# Patient Record
Sex: Female | Born: 1970 | Race: White | Hispanic: No | Marital: Married | Smoking: Never smoker
Health system: Southern US, Community
[De-identification: ages and names within clinical notes are randomized; demographics above are authoritative.]

---

## 2010-08-04 ENCOUNTER — Encounter
Admission: RE | Admit: 2010-08-04 | Discharge: 2010-08-04 | Payer: Self-pay | Source: Home / Self Care | Attending: Family Medicine | Admitting: Family Medicine

## 2011-10-05 ENCOUNTER — Other Ambulatory Visit: Payer: Self-pay | Admitting: Obstetrics and Gynecology

## 2011-10-05 DIAGNOSIS — Z1231 Encounter for screening mammogram for malignant neoplasm of breast: Secondary | ICD-10-CM

## 2011-10-26 ENCOUNTER — Ambulatory Visit
Admission: RE | Admit: 2011-10-26 | Discharge: 2011-10-26 | Disposition: A | Discharge status: Home / Self Care | Payer: BC Managed Care – PPO | Source: Ambulatory Visit | Attending: Obstetrics and Gynecology | Admitting: Obstetrics and Gynecology

## 2011-10-26 DIAGNOSIS — Z1231 Encounter for screening mammogram for malignant neoplasm of breast: Secondary | ICD-10-CM

## 2012-12-12 ENCOUNTER — Other Ambulatory Visit: Payer: Self-pay

## 2012-12-12 DIAGNOSIS — Z1231 Encounter for screening mammogram for malignant neoplasm of breast: Secondary | ICD-10-CM

## 2013-01-02 ENCOUNTER — Ambulatory Visit: Payer: BC Managed Care – PPO

## 2013-02-09 ENCOUNTER — Ambulatory Visit
Admission: RE | Admit: 2013-02-09 | Discharge: 2013-02-09 | Disposition: A | Discharge status: Home / Self Care | Payer: BC Managed Care – PPO | Source: Ambulatory Visit

## 2013-02-09 DIAGNOSIS — Z1231 Encounter for screening mammogram for malignant neoplasm of breast: Secondary | ICD-10-CM

## 2014-02-25 ENCOUNTER — Other Ambulatory Visit: Payer: Self-pay

## 2014-02-25 DIAGNOSIS — Z1231 Encounter for screening mammogram for malignant neoplasm of breast: Secondary | ICD-10-CM

## 2014-03-29 ENCOUNTER — Ambulatory Visit
Admission: RE | Admit: 2014-03-29 | Discharge: 2014-03-29 | Disposition: A | Discharge status: Home / Self Care | Payer: BC Managed Care – PPO | Source: Ambulatory Visit

## 2014-03-29 DIAGNOSIS — Z1231 Encounter for screening mammogram for malignant neoplasm of breast: Secondary | ICD-10-CM

## 2015-06-17 ENCOUNTER — Other Ambulatory Visit: Payer: Self-pay | Admitting: Obstetrics and Gynecology

## 2015-06-17 DIAGNOSIS — N6452 Nipple discharge: Secondary | ICD-10-CM

## 2015-07-04 ENCOUNTER — Ambulatory Visit
Admission: RE | Admit: 2015-07-04 | Discharge: 2015-07-04 | Disposition: A | Discharge status: Home / Self Care | Payer: BLUE CROSS/BLUE SHIELD | Source: Ambulatory Visit | Attending: Obstetrics and Gynecology | Admitting: Obstetrics and Gynecology

## 2015-07-04 ENCOUNTER — Other Ambulatory Visit: Payer: Self-pay | Admitting: Obstetrics and Gynecology

## 2015-07-04 DIAGNOSIS — N6452 Nipple discharge: Secondary | ICD-10-CM

## 2015-11-29 ENCOUNTER — Other Ambulatory Visit: Payer: Self-pay | Admitting: Obstetrics and Gynecology

## 2015-11-29 DIAGNOSIS — N6452 Nipple discharge: Secondary | ICD-10-CM

## 2015-11-30 ENCOUNTER — Other Ambulatory Visit: Payer: Self-pay | Admitting: Obstetrics and Gynecology

## 2015-11-30 DIAGNOSIS — N6452 Nipple discharge: Secondary | ICD-10-CM

## 2016-08-10 ENCOUNTER — Other Ambulatory Visit: Payer: Self-pay | Admitting: Obstetrics and Gynecology

## 2016-08-10 DIAGNOSIS — Z1231 Encounter for screening mammogram for malignant neoplasm of breast: Secondary | ICD-10-CM

## 2016-11-17 IMAGING — MG MM DIAG BREAST TOMO BILATERAL
6 of 9 series · 6 of 25 positions shown · non-contrast
Comparison: Previous exam(s).

CLINICAL DATA: Chronic right nipple discharge. Cytology evaluation
demonstrated presence of blood within the discharge.

EXAM:
DIGITAL DIAGNOSTIC BILATERAL MAMMOGRAM WITH 3D TOMOSYNTHESIS WITH
CAD
ULTRASOUND RIGHT BREAST

[R MLO (1 of 2)]
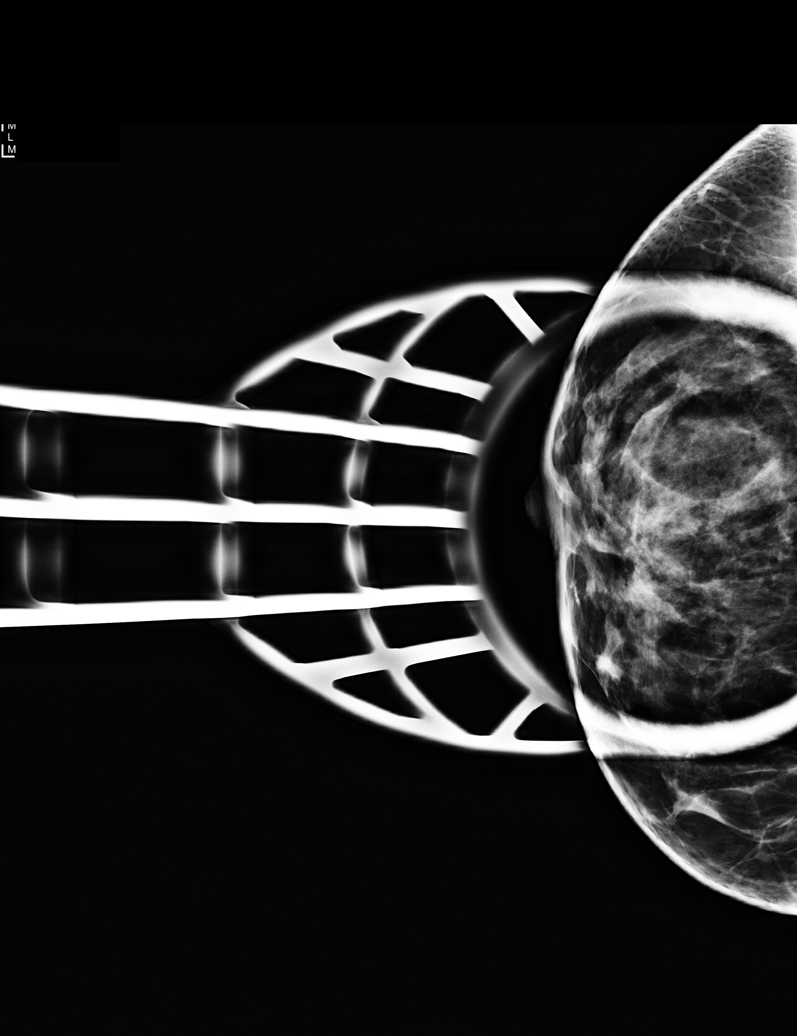

[L MLO]
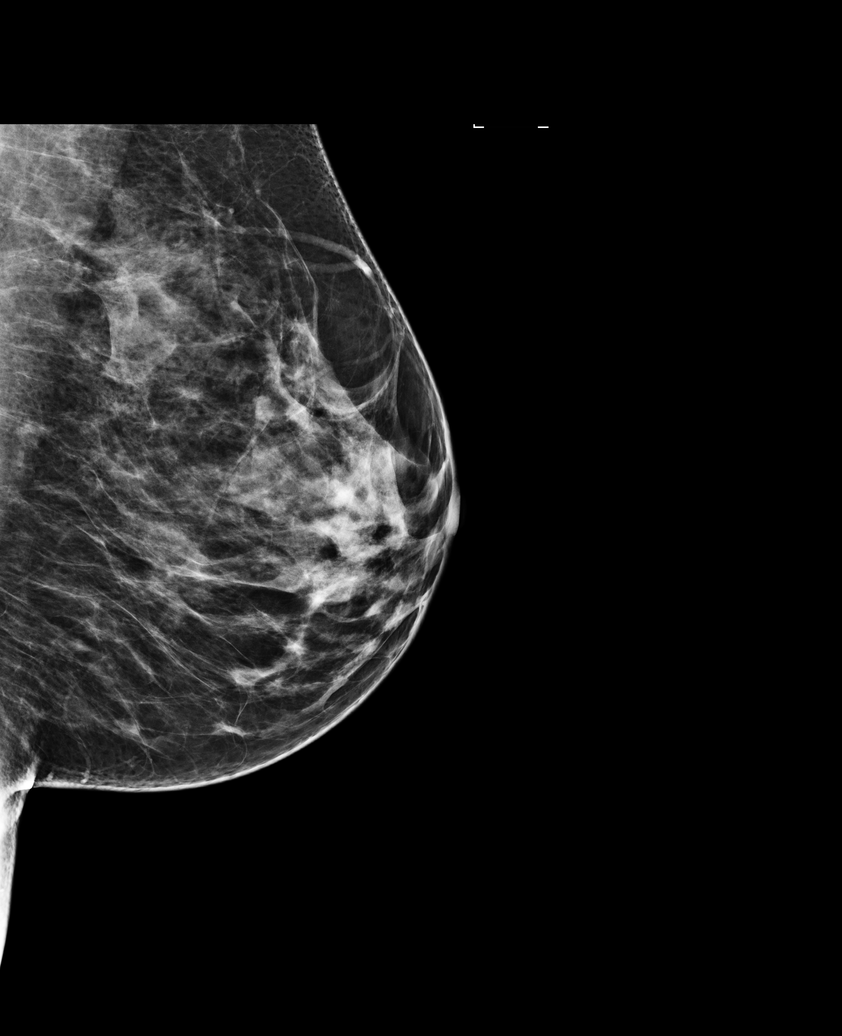

[R CC]
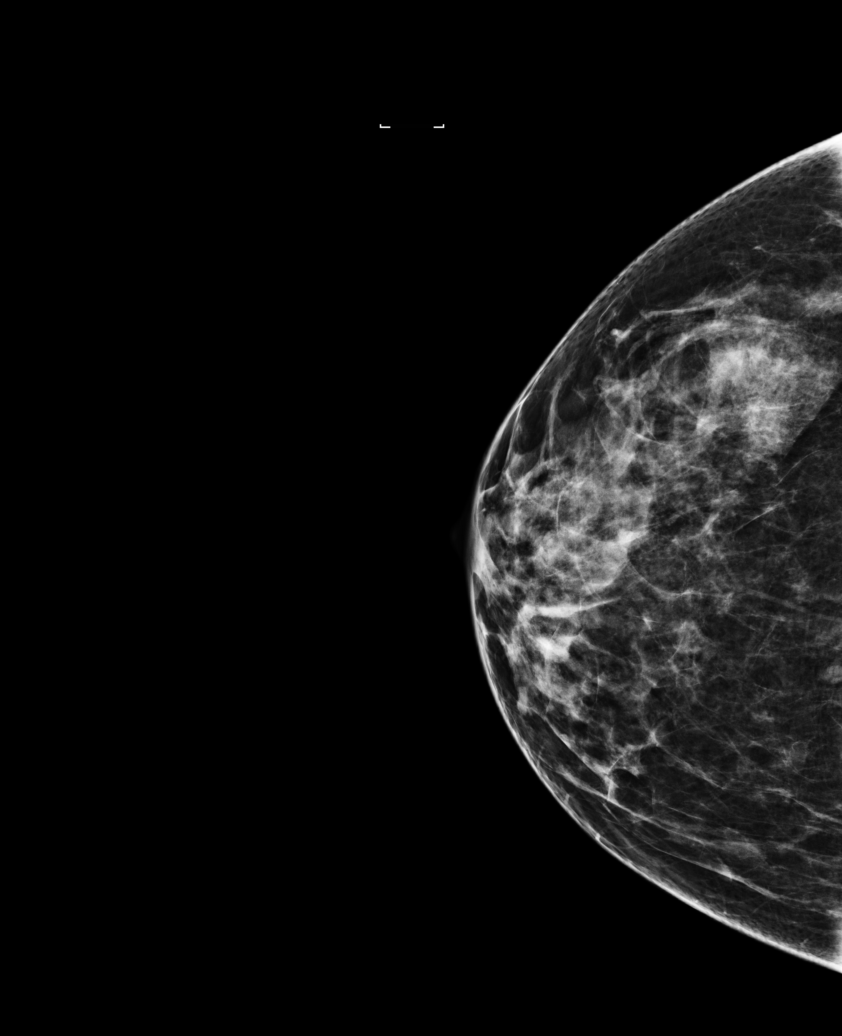

[L CC]
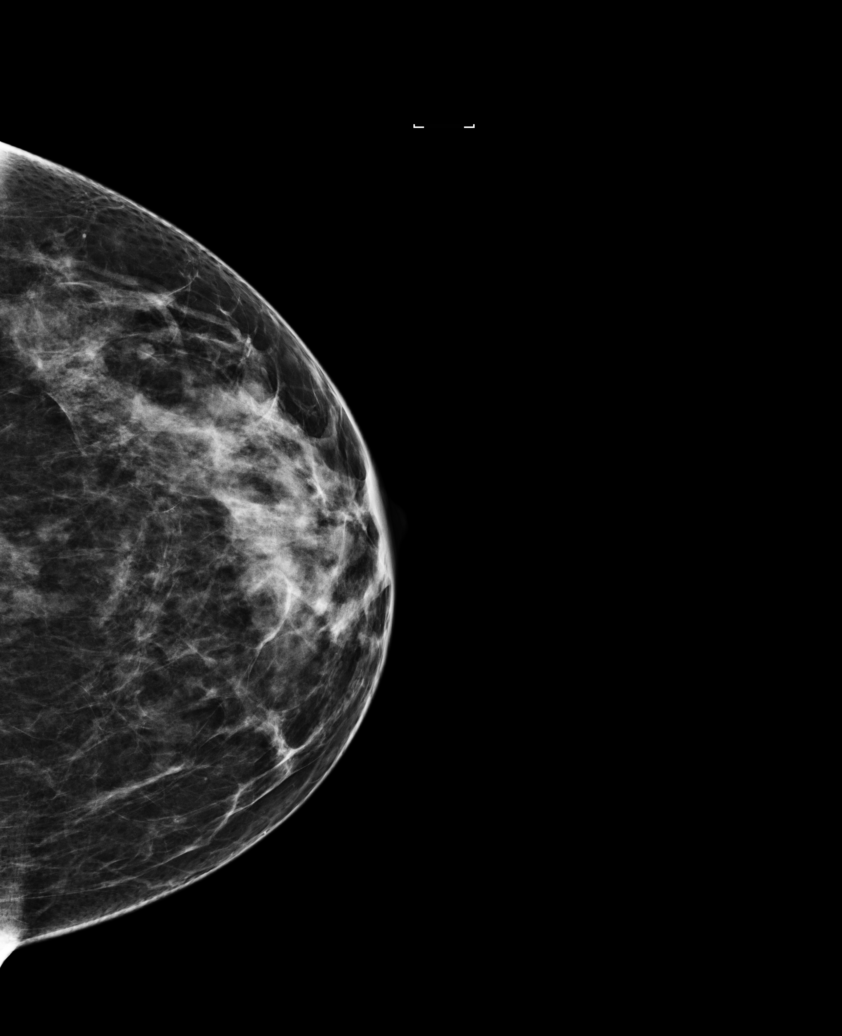

[R MLO (2 of 2)]
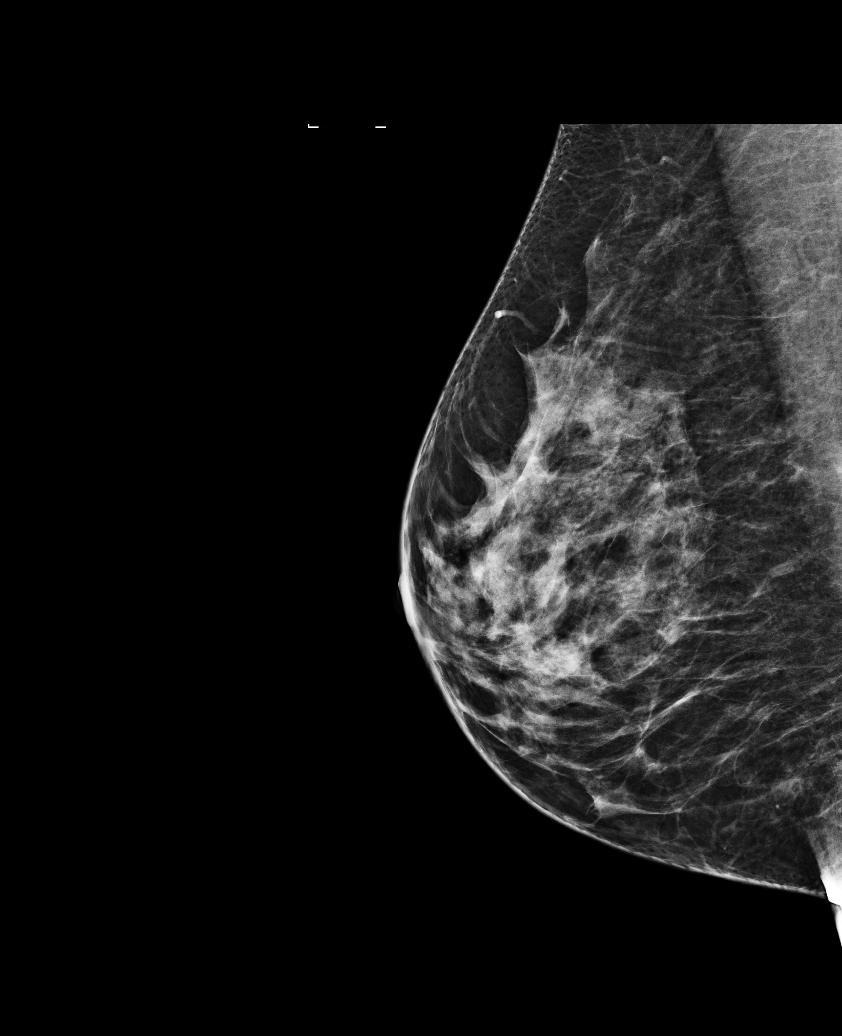

[R MLO tomo · tomo slice 37/72.0]
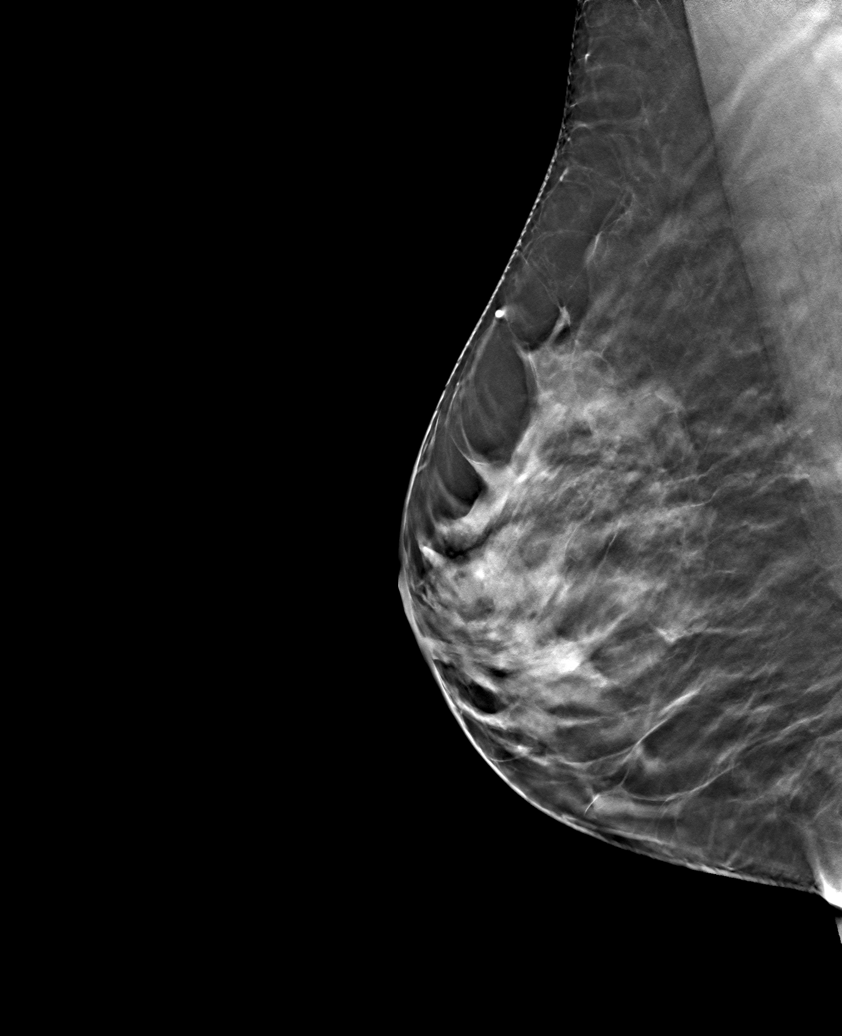

[6 of 25 positions shown; findings below may reference images not displayed]

ACR Breast Density Category c: The breast tissue is heterogeneously
dense, which may obscure small masses.
FINDINGS: Mammographically, there are no suspicious masses, areas of
architectural distortion or microcalcifications in either breast.

Mammographic images were processed with CAD.

On physical exam, no suspicious masses are found. Compression of the
right nipple elicits nipple discharge from 2 ducts. The duct in the
lower outer quadrant of the right breast expresses greenish milky
nipple discharge. The duct in the central right breast expresses
bloody nipple discharge. Because of the presence of bloody nipple
discharge, a ductogram was performed, cannulating the central duct
associated with bloody nipple discharge.

Targeted ultrasound is performed, showing no suspicious masses or
shadowing lesions. No significant duct ectasia is noted. Ultrasound
examination of the subareolar left breast was performed for
comparison and demonstrates similar appearance of subareolar ducts.
IMPRESSION: No mammographic or sonographic evidence of malignancy in either
breast.

Bloody nipple discharge was noted from a single duct in the central
right nipple, for which a ductogram was performed, which did not
demonstrate duct ectasia are or filling defects.

RECOMMENDATION:
Screening mammogram in one year.(Code:V1-7-49F).

Further management of patient's nipple discharge should be based on
clinical grounds. Contrast-enhanced MRI of the breast may be
considered if found clinically necessary.

I have discussed the findings and recommendations with the patient.
Results were also provided in writing at the conclusion of the
visit. If applicable, a reminder letter will be sent to the patient
regarding the next appointment.

BI-RADS CATEGORY  2: Benign.

## 2017-01-22 ENCOUNTER — Other Ambulatory Visit: Payer: Self-pay | Admitting: Orthopedic Surgery

## 2017-01-22 DIAGNOSIS — M25561 Pain in right knee: Secondary | ICD-10-CM

## 2017-02-08 ENCOUNTER — Ambulatory Visit
Admission: RE | Admit: 2017-02-08 | Discharge: 2017-02-08 | Disposition: A | Discharge status: Home / Self Care | Payer: PRIVATE HEALTH INSURANCE | Source: Ambulatory Visit | Attending: Orthopedic Surgery | Admitting: Orthopedic Surgery

## 2017-02-08 DIAGNOSIS — M25561 Pain in right knee: Secondary | ICD-10-CM

## 2018-04-14 ENCOUNTER — Other Ambulatory Visit: Payer: Self-pay | Admitting: Obstetrics and Gynecology

## 2018-04-14 DIAGNOSIS — Z1231 Encounter for screening mammogram for malignant neoplasm of breast: Secondary | ICD-10-CM

## 2018-09-05 ENCOUNTER — Other Ambulatory Visit: Payer: Self-pay | Admitting: Family Medicine

## 2018-09-05 ENCOUNTER — Ambulatory Visit
Admission: RE | Admit: 2018-09-05 | Discharge: 2018-09-05 | Disposition: A | Discharge status: Home / Self Care | Payer: No Typology Code available for payment source | Source: Ambulatory Visit | Attending: Family Medicine | Admitting: Family Medicine

## 2018-09-05 DIAGNOSIS — R05 Cough: Secondary | ICD-10-CM

## 2018-09-05 DIAGNOSIS — R0600 Dyspnea, unspecified: Secondary | ICD-10-CM

## 2018-09-05 DIAGNOSIS — R059 Cough, unspecified: Secondary | ICD-10-CM

## 2018-09-22 ENCOUNTER — Encounter: Payer: Self-pay | Admitting: Radiology

## 2018-09-22 ENCOUNTER — Ambulatory Visit
Admission: RE | Admit: 2018-09-22 | Discharge: 2018-09-22 | Disposition: A | Discharge status: Home / Self Care | Payer: PRIVATE HEALTH INSURANCE | Source: Ambulatory Visit | Attending: Obstetrics and Gynecology | Admitting: Obstetrics and Gynecology

## 2018-09-22 DIAGNOSIS — Z1231 Encounter for screening mammogram for malignant neoplasm of breast: Secondary | ICD-10-CM

## 2018-09-29 ENCOUNTER — Ambulatory Visit (INDEPENDENT_AMBULATORY_CARE_PROVIDER_SITE_OTHER): Payer: Self-pay | Admitting: Podiatry

## 2018-09-29 ENCOUNTER — Ambulatory Visit (INDEPENDENT_AMBULATORY_CARE_PROVIDER_SITE_OTHER): Payer: Self-pay

## 2018-09-29 ENCOUNTER — Encounter: Payer: Self-pay | Admitting: Podiatry

## 2018-09-29 ENCOUNTER — Other Ambulatory Visit: Payer: Self-pay | Admitting: Podiatry

## 2018-09-29 VITALS — BP 153/90 | HR 78

## 2018-09-29 DIAGNOSIS — M79671 Pain in right foot: Secondary | ICD-10-CM

## 2018-09-29 DIAGNOSIS — M76821 Posterior tibial tendinitis, right leg: Secondary | ICD-10-CM

## 2018-09-29 MED ORDER — MELOXICAM 15 MG PO TABS: 15.0000 mg | ORAL_TABLET | Freq: Every day | ORAL | 1 refills | Status: AC

## 2018-09-29 MED ORDER — METHYLPREDNISOLONE 4 MG PO TBPK: ORAL_TABLET | ORAL | 0 refills | Status: DC

## 2018-10-03 NOTE — Progress Notes (Signed)
    HPI: 48 year old female presenting today as a new patient with a chief complaint of throbbing pain to the right plantar heel and arch that began 4-5 months ago. She reports associated pain to the dorsomedial aspect of the foot. Lifting the foot, moving it and flexing it increases the pain. She has not done anything for treatment. Patient is here for further evaluation and treatment.   History reviewed. No pertinent past medical history.     Physical Exam: General: The patient is alert and oriented x3 in no acute distress.  Dermatology: Skin is warm, dry and supple bilateral lower extremities. Negative for open lesions or macerations.  Vascular: Palpable pedal pulses bilaterally. No edema or erythema noted. Capillary refill within normal limits.  Neurological: Epicritic and protective threshold grossly intact bilaterally.   Musculoskeletal Exam: Pain on palpation noted to the posterior tibial tendon of the right foot. Range of motion within normal limits. Muscle strength 5/5 in all muscle groups bilateral lower extremities.  Radiographic Exam:  Normal osseous mineralization. Joint spaces preserved. No fracture or dislocation identified.    Assessment: 1. Posterior tibial tendinitis right  Plan of Care:  1. Patient was evaluated. Radiographs were reviewed today. 2. Injection of 0.5 mL Celestone Soluspan injected into the posterior tibial tendon sheath.  3. Prescription for Medrol Dose Pak provided to patient. 4. Prescription for Meloxicam provided to patient. 5. Plantar fascial brace dispensed.  6. Recommended OTC insoles.  7. Return to clinic as needed.   General dentist.    Felecia Shelling, DPM [facility]  Dr. Felecia Shelling, DPM    [address]                                        [place], [place] [address]                Office [phone]  Fax [phone]

## 2019-01-28 ENCOUNTER — Telehealth: Payer: Self-pay | Admitting: Podiatry

## 2019-01-30 NOTE — Telephone Encounter (Signed)
I would like a copy of my x-rays that were taken when I saw Dr. Amalia Hailey. I didn't know if you could e-mail those to me?

## 2019-09-04 ENCOUNTER — Other Ambulatory Visit: Payer: Self-pay | Admitting: Obstetrics and Gynecology

## 2019-09-04 DIAGNOSIS — Z1231 Encounter for screening mammogram for malignant neoplasm of breast: Secondary | ICD-10-CM

## 2019-10-23 ENCOUNTER — Ambulatory Visit
Admission: RE | Admit: 2019-10-23 | Discharge: 2019-10-23 | Disposition: A | Discharge status: Home / Self Care | Payer: Self-pay | Source: Ambulatory Visit | Attending: Obstetrics and Gynecology | Admitting: Obstetrics and Gynecology

## 2019-10-23 ENCOUNTER — Other Ambulatory Visit: Payer: Self-pay

## 2019-10-23 DIAGNOSIS — Z1231 Encounter for screening mammogram for malignant neoplasm of breast: Secondary | ICD-10-CM

## 2020-02-09 ENCOUNTER — Other Ambulatory Visit: Payer: Self-pay | Admitting: Obstetrics and Gynecology

## 2020-02-09 DIAGNOSIS — Z1231 Encounter for screening mammogram for malignant neoplasm of breast: Secondary | ICD-10-CM

## 2020-07-07 ENCOUNTER — Other Ambulatory Visit: Payer: Self-pay | Admitting: Family Medicine

## 2020-08-03 ENCOUNTER — Other Ambulatory Visit: Payer: Self-pay | Admitting: Family Medicine

## 2020-08-03 DIAGNOSIS — N644 Mastodynia: Secondary | ICD-10-CM

## 2021-09-15 ENCOUNTER — Encounter: Payer: Self-pay | Admitting: Gastroenterology

## 2021-11-13 ENCOUNTER — Encounter: Payer: No Typology Code available for payment source | Admitting: Gastroenterology

## 2021-12-04 ENCOUNTER — Encounter: Payer: No Typology Code available for payment source | Admitting: Gastroenterology

## 2023-02-09 ENCOUNTER — Other Ambulatory Visit: Payer: Self-pay

## 2023-02-09 ENCOUNTER — Emergency Department (HOSPITAL_COMMUNITY)
Admission: EM | Admit: 2023-02-09 | Discharge: 2023-02-10 | Disposition: A | Discharge status: Home / Self Care | Payer: PRIVATE HEALTH INSURANCE | Attending: Emergency Medicine | Admitting: Emergency Medicine

## 2023-02-09 ENCOUNTER — Encounter (HOSPITAL_COMMUNITY): Payer: Self-pay

## 2023-02-09 DIAGNOSIS — R1084 Generalized abdominal pain: Secondary | ICD-10-CM | POA: Insufficient documentation

## 2023-02-09 LAB — I-STAT BETA HCG BLOOD, ED (MC, WL, AP ONLY): I-stat hCG, quantitative: <5 m[IU]/mL (ref <5)

## 2023-02-09 LAB — URINALYSIS, ROUTINE W REFLEX MICROSCOPIC
Bilirubin Urine: NEGATIVE
Glucose, UA: NEGATIVE mg/dL
Ketones, ur: 80 mg/dL — AB
Leukocytes,Ua: NEGATIVE
Nitrite: NEGATIVE
Protein, ur: 30 mg/dL — AB
Specific Gravity, Urine: 1.021 (ref 1.005–1.030)
pH: 6 (ref 5.0–8.0)

## 2023-02-09 LAB — COMPREHENSIVE METABOLIC PANEL
ALT: 14 U/L (ref 0–44)
AST: 19 U/L (ref 15–41)
Albumin: 4 g/dL (ref 3.5–5.0)
Alkaline Phosphatase: 60 U/L (ref 38–126)
Anion gap: 11 (ref 5–15)
BUN: 8 mg/dL (ref 6–20)
CO2: 22 mmol/L (ref 22–32)
Calcium: 8.8 mg/dL — ABNORMAL LOW (ref 8.9–10.3)
Chloride: 99 mmol/L (ref 98–111)
Creatinine, Ser: 0.6 mg/dL (ref 0.44–1.00)
GFR, Estimated: >60 mL/min (ref >60)
Glucose, Bld: 106 mg/dL — ABNORMAL HIGH (ref 70–99)
Potassium: 3.2 mmol/L — ABNORMAL LOW (ref 3.5–5.1)
Sodium: 132 mmol/L — ABNORMAL LOW (ref 135–145)
Total Bilirubin: 1.1 mg/dL (ref 0.3–1.2)
Total Protein: 7.3 g/dL (ref 6.5–8.1)

## 2023-02-09 LAB — CBC
HCT: 39.3 % (ref 36.0–46.0)
Hemoglobin: 13.5 g/dL (ref 12.0–15.0)
MCH: 30.7 pg (ref 26.0–34.0)
MCHC: 34.4 g/dL (ref 30.0–36.0)
MCV: 89.3 fL (ref 80.0–100.0)
Platelets: 258 10*3/uL (ref 150–400)
RBC: 4.4 MIL/uL (ref 3.87–5.11)
RDW: 11.9 % (ref 11.5–15.5)
WBC: 9 10*3/uL (ref 4.0–10.5)
nRBC: 0 % (ref 0.0–0.2)

## 2023-02-09 LAB — LIPASE, BLOOD: Lipase: 29 U/L (ref 11–51)

## 2023-02-09 NOTE — ED Triage Notes (Signed)
Pt arrived from home via POV c/o abd pain, intermittent at times 9/10 pain scale. Pain began 4 days ago. Pt was seen at ED in [facility] for same c/o with no resolve. Pt states that the pain has only gotten worse.

## 2023-02-10 ENCOUNTER — Emergency Department (HOSPITAL_COMMUNITY): Payer: PRIVATE HEALTH INSURANCE

## 2023-02-10 LAB — TROPONIN I (HIGH SENSITIVITY): Troponin I (High Sensitivity): 4 ng/L

## 2023-02-10 MED ORDER — SODIUM CHLORIDE 0.9 % IV BOLUS
1000.0000 mL | Freq: Once | INTRAVENOUS | Status: AC
  Administered 2023-02-10: 1000 mL via INTRAVENOUS

## 2023-02-10 MED ORDER — ONDANSETRON 4 MG PO TBDP: ORAL_TABLET | ORAL | 0 refills | Status: AC

## 2023-02-10 MED ORDER — MORPHINE SULFATE (PF) 4 MG/ML IV SOLN
4.0000 mg | Freq: Once | INTRAVENOUS | Status: AC
  Administered 2023-02-10: 4 mg via INTRAVENOUS
  Filled 2023-02-10: qty 1

## 2023-02-10 MED ORDER — ALUM & MAG HYDROXIDE-SIMETH 200-200-20 MG/5ML PO SUSP
30.0000 mL | Freq: Once | ORAL | Status: AC
  Administered 2023-02-10: 30 mL via ORAL
  Filled 2023-02-10: qty 30

## 2023-02-10 MED ORDER — IOHEXOL 350 MG/ML SOLN
75.0000 mL | Freq: Once | INTRAVENOUS | Status: AC | PRN
  Administered 2023-02-10: 75 mL via INTRAVENOUS

## 2023-02-10 MED ORDER — DICYCLOMINE HCL 20 MG PO TABS: 20.0000 mg | ORAL_TABLET | Freq: Two times a day (BID) | ORAL | 0 refills | Status: AC

## 2023-02-10 MED ORDER — ONDANSETRON HCL 4 MG/2ML IJ SOLN
4.0000 mg | Freq: Once | INTRAMUSCULAR | Status: AC
  Administered 2023-02-10: 4 mg via INTRAVENOUS
  Filled 2023-02-10: qty 2

## 2023-02-10 NOTE — ED Provider Notes (Signed)
[place] EMERGENCY DEPARTMENT AT [facility] Provider Note   CSN: [id number] Arrival date & time: 02/09/23  1858     History  Chief Complaint  Patient presents with   Abdominal Pain    Rebekah Cole is a 52 y.o. female.  52 yo F with a chief complaint of abdominal pain.  Going on for about 4 days now.  Seems to come and go.  Mostly to the upper part of the abdomen but kind of hurts everywhere.  Nothing seems to make it better or worse.  Has had nausea but no vomiting.  Denies diarrhea denies fevers.  No prior abdominal surgery.   Abdominal Pain      Home Medications Prior to Admission medications   Medication Sig Start Date End Date Taking? Authorizing Provider  dicyclomine (BENTYL) 20 MG tablet Take 1 tablet (20 mg total) by mouth 2 (two) times daily. 02/10/23  Yes Melene Plan, DO  doxycycline (VIBRAMYCIN) 100 MG capsule Take 100 mg by mouth daily as needed (for acne).   Yes [provider]  escitalopram (LEXAPRO) 20 MG tablet Take 10 tablets by mouth daily. 01/22/17  Yes [provider]  levonorgestrel (MIRENA, 52 MG,) 20 MCG/24HR IUD Mirena 20 mcg/24 hours (5 yrs) 52 mg intrauterine device  Take 1 device by intrauterine route.   Yes [provider]  ondansetron (ZOFRAN-ODT) 4 MG disintegrating tablet 4mg  ODT q4 hours prn nausea/vomit 02/10/23  Yes Melene Plan, DO      Allergies    Patient has no known allergies.    Review of Systems   Review of Systems  Gastrointestinal:  Positive for abdominal pain.    Physical Exam Updated Vital Signs BP 138/72   Pulse 66   Temp 98.5 F (36.9 C)   Resp 18   LMP  (LMP Unknown)   SpO2 99%  Physical Exam Vitals and nursing note reviewed.  Constitutional:      General: She is not in acute distress.    Appearance: She is well-developed. She is not diaphoretic.  HENT:     Head: Normocephalic and atraumatic.  Eyes:     Pupils: Pupils are equal, round, and reactive to light.  Cardiovascular:      Rate and Rhythm: Normal rate and regular rhythm.     Heart sounds: No murmur heard.    No friction rub. No gallop.  Pulmonary:     Effort: Pulmonary effort is normal.     Breath sounds: No wheezing or rales.  Abdominal:     General: There is no distension.     Palpations: Abdomen is soft.     Tenderness: There is abdominal tenderness.     Comments: Mild diffuse tenderness without focality  Musculoskeletal:        General: No tenderness.     Cervical back: Normal range of motion and neck supple.  Skin:    General: Skin is warm and dry.  Neurological:     Mental Status: She is alert and oriented to person, place, and time.  Psychiatric:        Behavior: Behavior normal.     ED Results / Procedures / Treatments   Labs (all labs ordered are listed, but only abnormal results are displayed) Labs Reviewed  COMPREHENSIVE METABOLIC PANEL - Abnormal; Notable for the following components:      Result Value   Sodium 132 (*)    Potassium 3.2 (*)    Glucose, Bld 106 (*)    Calcium  8.8 (*)    All other components within normal limits  URINALYSIS, ROUTINE W REFLEX MICROSCOPIC - Abnormal; Notable for the following components:   Hgb urine dipstick SMALL (*)    Ketones, ur 80 (*)    Protein, ur 30 (*)    Bacteria, UA RARE (*)    All other components within normal limits  LIPASE, BLOOD  CBC  I-STAT BETA HCG BLOOD, ED ([facility], [facility], [facility] ONLY)  TROPONIN I (HIGH SENSITIVITY)    EKG EKG Interpretation  Date/Time:  Sunday February 10 2023 02:25:50 EDT Ventricular Rate:  56 PR Interval:  164 QRS Duration: 78 QT Interval:  468 QTC Calculation: 452 R Axis:   68 Text Interpretation: Sinus rhythm Anteroseptal infarct, age indeterminate No old tracing to compare Confirmed by Melene Plan [phone]) on 02/10/2023 2:40:37 AM  Radiology CT ABDOMEN PELVIS W CONTRAST  Result Date: 02/10/2023 CLINICAL DATA:  Abdominal pain. EXAM: CT ABDOMEN AND PELVIS WITH CONTRAST TECHNIQUE: Multidetector CT imaging of  the abdomen and pelvis was performed using the standard protocol following bolus administration of intravenous contrast. RADIATION DOSE REDUCTION: This exam was performed according to the departmental dose-optimization program which includes automated exposure control, adjustment of the mA and/or kV according to patient size and/or use of iterative reconstruction technique. CONTRAST:  75mL OMNIPAQUE IOHEXOL 350 MG/ML SOLN COMPARISON:  None Available. FINDINGS: Lower chest: Mild atelectasis is seen within the posterior aspects of the bilateral lung bases. Hepatobiliary: No focal liver abnormality is seen. The gallbladder is moderately distended. No gallstones, gallbladder wall thickening, or biliary dilatation. Pancreas: Unremarkable. No pancreatic ductal dilatation or surrounding inflammatory changes. Spleen: Normal in size without focal abnormality. Adrenals/Urinary Tract: Adrenal glands are unremarkable. Kidneys are normal, without renal calculi, focal lesion, or hydronephrosis. The urinary bladder is moderately distended and is otherwise unremarkable. Stomach/Bowel: Stomach is within normal limits. Appendix appears normal. No evidence of bowel wall thickening, distention, or inflammatory changes. Vascular/Lymphatic: No significant vascular findings are present. No enlarged abdominal or pelvic lymph nodes. Reproductive: A properly positioned IUD is seen within a mildly heterogeneous uterus. The right adnexa is unremarkable. A 4.3 cm x 3.0 cm x 3.9 cm well-defined area of low attenuation (approximately 61.57 Hounsfield units) is seen along the lateral aspect of the uterus on the left. Other: No abdominal wall hernia or abnormality. No abdominopelvic ascites. Musculoskeletal: No acute or significant osseous findings. IMPRESSION: 1. 4.3 cm x 3.0 cm x 3.9 cm well-defined area of low attenuation along the lateral aspect of the uterus on the left. While this may represent a hemorrhagic left ovarian cyst, correlation  with pelvic ultrasound is recommended. Electronically Signed   By: Aram Candela M.D.   On: 02/10/2023 04:06   DG Chest Port 1 View  Result Date: 02/10/2023 CLINICAL DATA:  Chest pain EXAM: PORTABLE CHEST 1 VIEW COMPARISON:  Radiographs 09/05/2018 FINDINGS: Stable cardiomediastinal silhouette. No focal consolidation, pleural effusion, or pneumothorax. No displaced rib fractures. IMPRESSION: No active disease. Electronically Signed   By: Minerva Fester M.D.   On: 02/10/2023 03:14    Procedures Procedures    Medications Ordered in ED Medications  sodium chloride 0.9 % bolus 1,000 mL (1,000 mLs Intravenous New Bag/Given 02/10/23 0233)  morphine (PF) 4 MG/ML injection 4 mg (4 mg Intravenous Given 02/10/23 0232)  ondansetron (ZOFRAN) injection 4 mg (4 mg Intravenous Given 02/10/23 0232)  alum & mag hydroxide-simeth (MAALOX/MYLANTA) 200-200-20 MG/5ML suspension 30 mL (30 mLs Oral Given 02/10/23 0232)  iohexol (OMNIPAQUE) 350 MG/ML injection 75 mL (  75 mLs Intravenous Contrast Given 02/10/23 0347)    ED Course/ Medical Decision Making/ A&P                             Medical Decision Making Amount and/or Complexity of Data Reviewed Labs: ordered. Radiology: ordered. ECG/medicine tests: ordered.  Risk OTC drugs. Prescription drug management.   52 yo F with a chief complaints of diffuse abdominal pain.  This been coming and going for about 4 days now.  Worse more towards the upper part of the abdomen.  No obvious focal finding on exam.  Lab work is largely unremarkable LFTs and lipase are unremarkable no leukocytosis.  UA negative for infection.  Some signs of dehydration will give a bolus of IV fluids.  She does appear to be quite uncomfortable on initial exam.  Will obtain an EKG chest x-ray and troponin to assess for atypical presentation of MI or pulmonary pathology.  She was seen in the outside ED a couple days ago and had CT imaging that was negative.  I cannot visualize this in with  worsening and persistent symptoms we will obtain CT imaging here.  CT without obvious acute pathology that would explain her symptoms.  Possible ovarian cyst.    Will d/c home.  PCP follow up.  4:50 AM:  I have discussed the diagnosis/risks/treatment options with the patient and family.  Evaluation and diagnostic testing in the emergency department does not suggest an emergent condition requiring admission or immediate intervention beyond what has been performed at this time.  They will follow up with PCP. We also discussed returning to the ED immediately if new or worsening sx occur. We discussed the sx which are most concerning (e.g., sudden worsening pain, fever, inability to tolerate by mouth) that necessitate immediate return. Medications administered to the patient during their visit and any new prescriptions provided to the patient are listed below.  Medications given during this visit Medications  sodium chloride 0.9 % bolus 1,000 mL (1,000 mLs Intravenous New Bag/Given 02/10/23 0233)  morphine (PF) 4 MG/ML injection 4 mg (4 mg Intravenous Given 02/10/23 0232)  ondansetron (ZOFRAN) injection 4 mg (4 mg Intravenous Given 02/10/23 0232)  alum & mag hydroxide-simeth (MAALOX/MYLANTA) 200-200-20 MG/5ML suspension 30 mL (30 mLs Oral Given 02/10/23 0232)  iohexol (OMNIPAQUE) 350 MG/ML injection 75 mL (75 mLs Intravenous Contrast Given 02/10/23 0347)     The patient appears reasonably screen and/or stabilized for discharge and I doubt any other medical condition or other [facility] requiring further screening, evaluation, or treatment in the ED at this time prior to discharge.          Final Clinical Impression(s) / ED Diagnoses Final diagnoses:  Generalized abdominal pain    Rx / DC Orders ED Discharge Orders          Ordered    dicyclomine (BENTYL) 20 MG tablet  2 times daily        02/10/23 0447    ondansetron (ZOFRAN-ODT) 4 MG disintegrating tablet        02/10/23 0447               Melene Plan, DO 02/10/23 [phone]

## 2023-02-10 NOTE — Discharge Instructions (Signed)
Try pepcid or tagamet up to twice a day.  Try to avoid things that may make this worse, most commonly these are spicy foods tomato based products fatty foods chocolate and peppermint.  Alcohol and tobacco can also make this worse.  Return to the emergency department for sudden worsening pain fever or inability to eat or drink.  

## 2024-02-05 ENCOUNTER — Other Ambulatory Visit: Payer: Self-pay | Admitting: Obstetrics and Gynecology

## 2024-02-05 DIAGNOSIS — Z1231 Encounter for screening mammogram for malignant neoplasm of breast: Secondary | ICD-10-CM

## 2024-03-11 ENCOUNTER — Other Ambulatory Visit: Payer: Self-pay | Admitting: Emergency Medicine

## 2024-03-11 DIAGNOSIS — M25569 Pain in unspecified knee: Secondary | ICD-10-CM

## 2024-04-17 ENCOUNTER — Ambulatory Visit
Admission: RE | Admit: 2024-04-17 | Discharge: 2024-04-17 | Disposition: A | Discharge status: Home / Self Care | Payer: PRIVATE HEALTH INSURANCE | Source: Ambulatory Visit | Attending: Emergency Medicine | Admitting: Emergency Medicine

## 2024-04-17 DIAGNOSIS — M25569 Pain in unspecified knee: Secondary | ICD-10-CM

## 2024-07-06 ENCOUNTER — Ambulatory Visit
Admission: RE | Admit: 2024-07-06 | Discharge: 2024-07-06 | Disposition: A | Source: Ambulatory Visit | Attending: Obstetrics and Gynecology | Admitting: Obstetrics and Gynecology

## 2024-07-06 DIAGNOSIS — Z1231 Encounter for screening mammogram for malignant neoplasm of breast: Secondary | ICD-10-CM
